# Patient Record
Sex: Female | Born: 1990 | Hispanic: Yes | Marital: Single | Smoking: Never smoker
Health system: Southern US, Community
[De-identification: ages and names within clinical notes are randomized; demographics above are authoritative.]

## PROBLEM LIST (undated history)

## (undated) DIAGNOSIS — O24419 Gestational diabetes mellitus in pregnancy, unspecified control: Secondary | ICD-10-CM

## (undated) DIAGNOSIS — D649 Anemia, unspecified: Secondary | ICD-10-CM

## (undated) HISTORY — PX: APPENDECTOMY: SHX54

## (undated) HISTORY — DX: Gestational diabetes mellitus in pregnancy, unspecified control: O24.419

## (undated) HISTORY — DX: Anemia, unspecified: D64.9

---
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
## 2009-12-10 ENCOUNTER — Ambulatory Visit: Payer: Self-pay | Admitting: Emergency Medicine

## 2009-12-12 LAB — PATHOLOGY REPORT

## 2011-03-03 ENCOUNTER — Ambulatory Visit: Payer: Self-pay | Admitting: Advanced Practice Midwife

## 2011-06-03 ENCOUNTER — Ambulatory Visit: Payer: Self-pay | Admitting: Advanced Practice Midwife

## 2011-07-08 ENCOUNTER — Observation Stay: Payer: Self-pay | Admitting: Obstetrics and Gynecology

## 2011-07-28 ENCOUNTER — Inpatient Hospital Stay: Payer: Self-pay | Admitting: Obstetrics and Gynecology

## 2011-07-28 LAB — CBC WITH DIFFERENTIAL/PLATELET
Basophil #: 0 10*3/uL (ref 0.0–0.1)
Eosinophil #: 0 10*3/uL (ref 0.0–0.7)
HCT: 40.8 % (ref 35.0–47.0)
Lymphocyte #: 1.8 10*3/uL (ref 1.0–3.6)
MCHC: 33.3 g/dL (ref 32.0–36.0)
MCV: 94 fL (ref 80–100)
Monocyte #: 0.3 x10 3/mm (ref 0.2–0.9)
Monocyte %: 5.2 %
Neutrophil #: 3.1 10*3/uL (ref 1.4–6.5)
Platelet: 110 10*3/uL — ABNORMAL LOW (ref 150–440)
RDW: 15 % — ABNORMAL HIGH (ref 11.5–14.5)
WBC: 5.3 10*3/uL (ref 3.6–11.0)

## 2011-07-29 LAB — HEMOGLOBIN: HGB: 9.9 g/dL — ABNORMAL LOW (ref 12.0–16.0)

## 2011-07-30 LAB — CBC WITH DIFFERENTIAL/PLATELET
Eosinophil %: 0.6 %
Lymphocyte #: 4.3 10*3/uL — ABNORMAL HIGH (ref 1.0–3.6)
Monocyte %: 4.2 %
Neutrophil #: 4 10*3/uL (ref 1.4–6.5)
RBC: 2.96 10*6/uL — ABNORMAL LOW (ref 3.80–5.20)

## 2012-12-09 IMAGING — US US OB FOLLOW-UP
1 series · 13 of 28 positions shown · non-contrast
Comparison: none

REASON FOR EXAM: reaccess placenta location
COMMENTS:

[Series 1: us ob follow-up · 0.28mm/px · 13 of 37 slices shown]
[im 2/37]
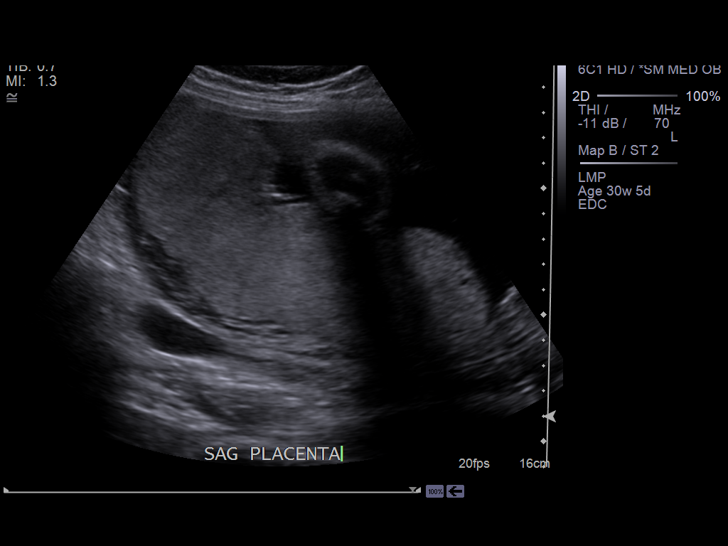
[im 5/37]
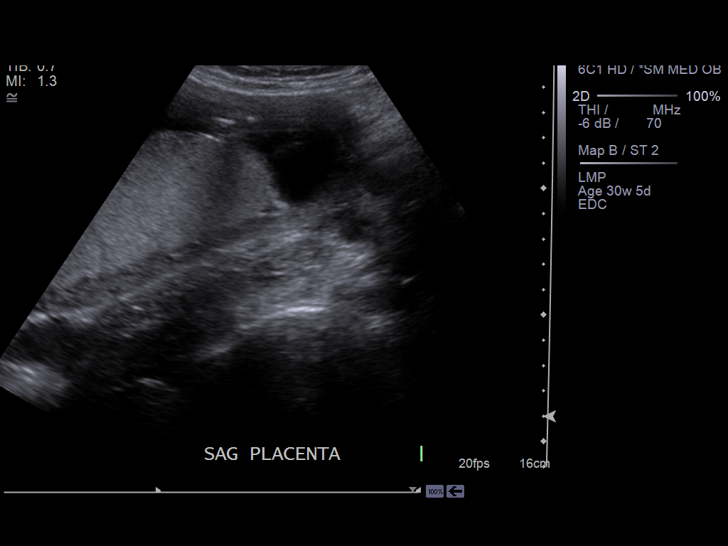
[im 7/37]
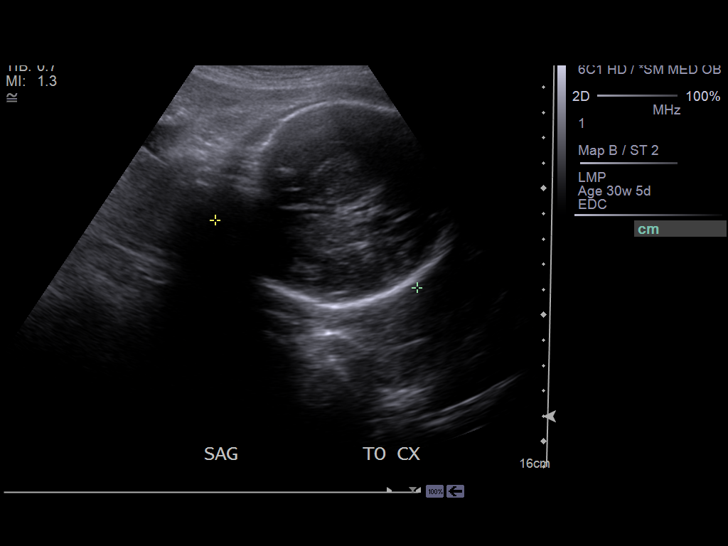
[im 10/37]
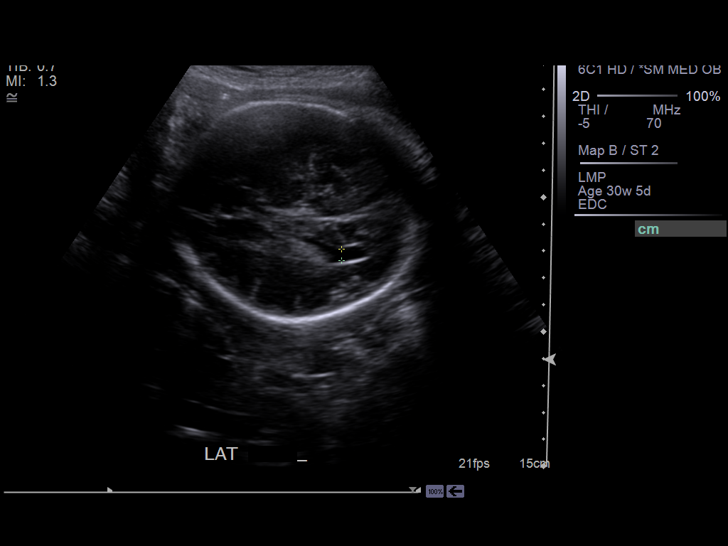
[im 13/37]
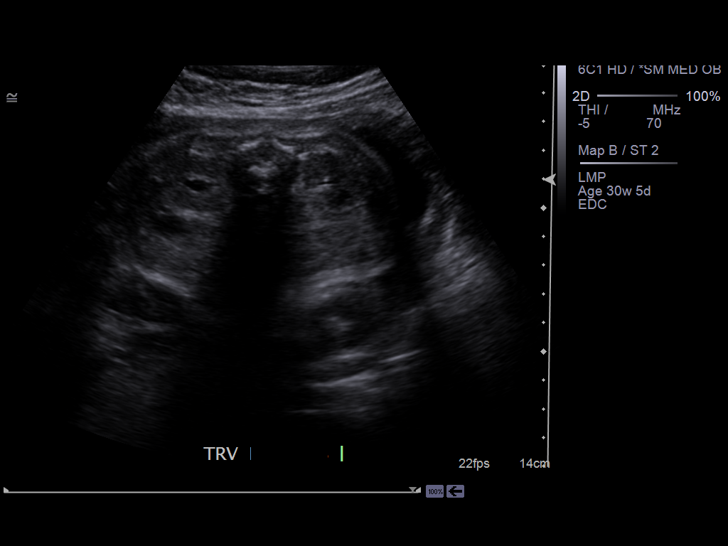
[im 15/37]
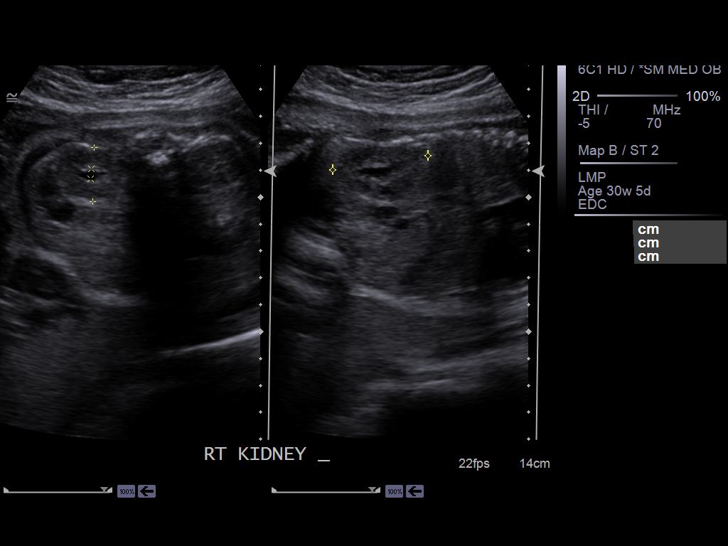
[im 19/37]
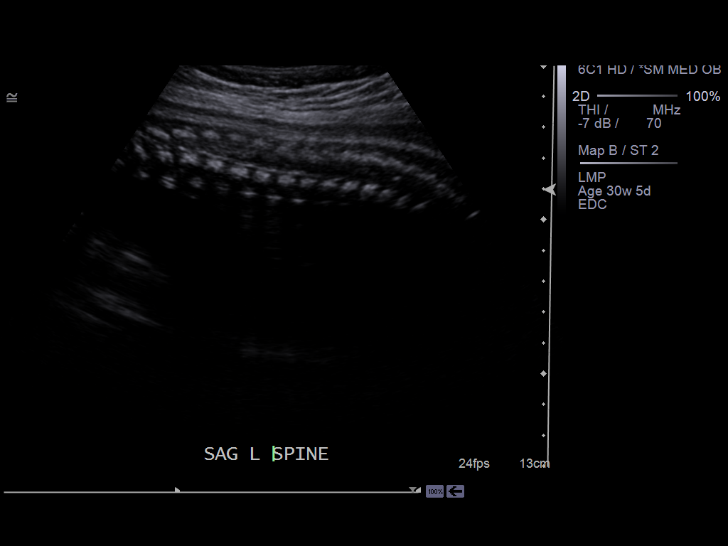
[im 22/37]
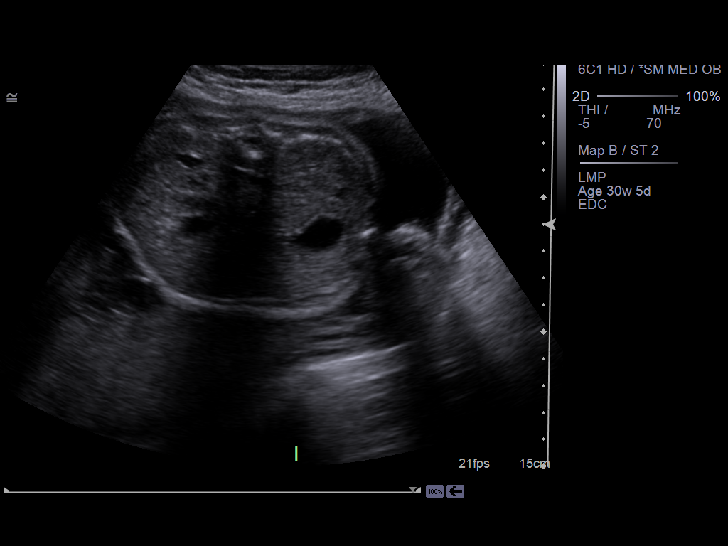
[im 25/37]
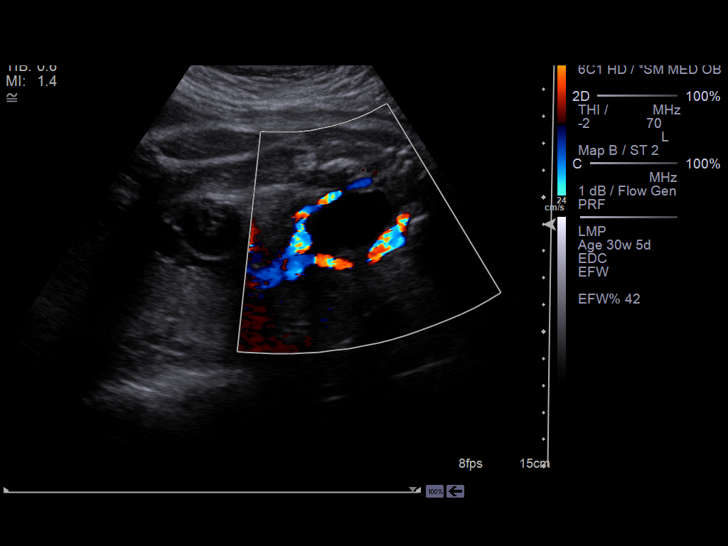
[im 27/37]
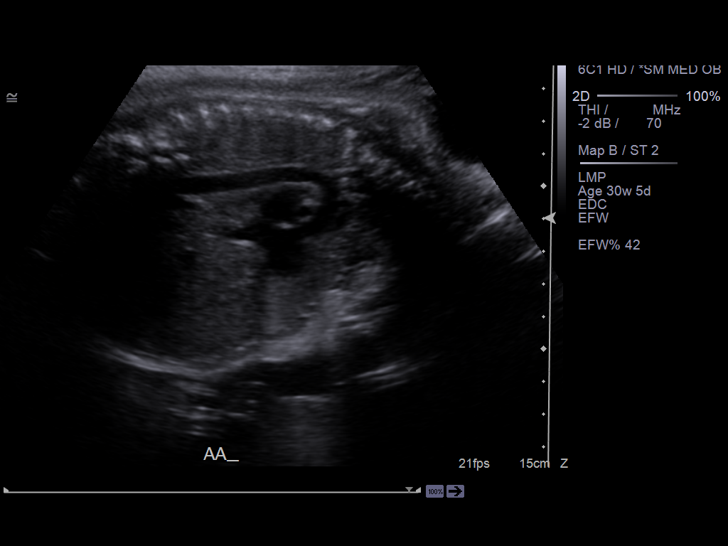
[im 30/37]
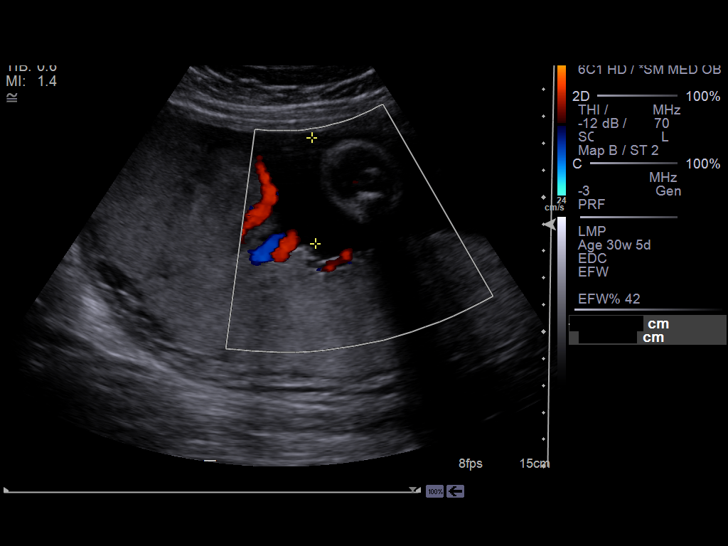
[im 33/37]
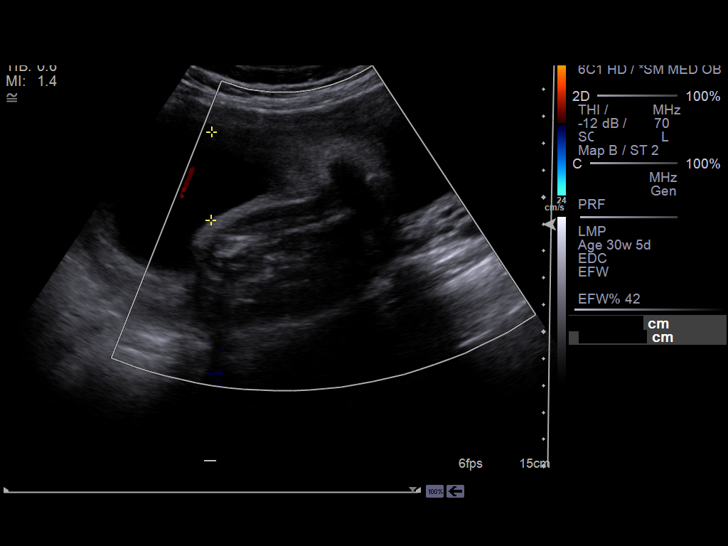
[im 35/37]
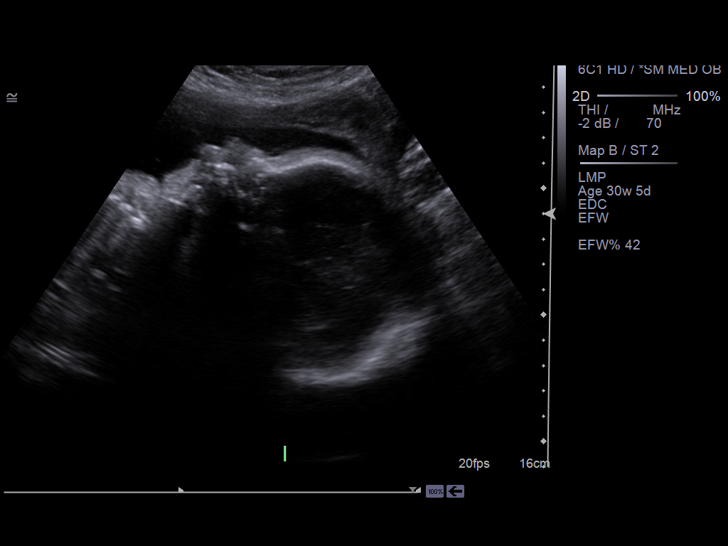

[13 of 28 positions shown; findings below may reference images not displayed]

PROCEDURE:     US  - US OB FOLLOW UP  - June 03, 2011 [DATE]

RESULT:     Follow-up OB examination was performed as requested. There is
observed a single living intrauterine gestation. Presentation currently is
cephalic. Fetal heart rate was monitored at 140 beats per minute. Amnionic
fluid volume appears normal. The placenta is posterior. The inferior tip of
the placenta is approximately 8.4 cm above the cervix. The placenta is no
longer low lying.

Fetal measurements are as follows:

          BPD is 7.93 cm; corresponding to 31 weeks, 6 days
            HC is 29.19 cm; corresponding to 32 weeks, 1 day
            AC is 26.84 cm; corresponding to 31 weeks, 0 days
            FL is 5.74 cm; corresponding to 30 weeks, 1 day
            HL is 4.89 cm; corresponding to 28 weeks, 5 days

EFW is 1,653 grams + / - 245 grams. Based on today's measurements, average
ultrasound age is 30 weeks, 5 days and ultrasound EDD is 08/17/2011. There
has been appropriate interval growth since the previous exam of March 03, 2011.
IMPRESSION: 1.  There is observed a living intrauterine gestation, cephalic presentation.
2.  The placenta is no longer low lying. The inferior tip of the placenta is
approximately 8.4 cm from the cervix.

[REDACTED]

## 2014-05-14 NOTE — H&P (Signed)
L&D Evaluation:  History:   HPI 24 yo G3P1011 with LMP of 11/01/10 & EDD of 08/08/11 by dates ad 08/06/11 by US at 17 weeks. PNC at [facility] significant for low lying placenta which resolved, Tdap UTD, [facility] with "UC's since last pm", no VB, no decreased FM, no ROM. UC's hurting pt and she wanted to get them checked out. 3 h Equivocal, diet education per nurtirionist.    Presents with contractions    Patient's Medical History No Chronic Illness    Patient's Surgical History Appendectomy  2011    Medications Pre Natal Vitamins    Allergies NKDA    Family History Non-Contributory   ROS:   ROS All systems were reviewed.  HEENT, CNS, GI, GU, Respiratory, CV, Renal and Musculoskeletal systems were found to be normal.   Exam:   Vital Signs stable    General no apparent distress    Mental Status clear    Chest clear    Heart normal sinus rhythm, no murmur/gallop/rubs    Abdomen gravid, non-tender    Estimated Fetal Weight Average for gestational age    Back no CVAT    Edema 1+    Reflexes 1+    Clonus negative    Pelvic cervix closed and thick    Mebranes Intact    FHT normal rate with no decels, reactive NST with 2 accels 15 x 15 bpm    Ucx irregular    Skin dry    Lymph no lymphadenopathy   Impression:   Impression IUp at 35 5/7 weeks   Plan:   Plan monitor contractions and for cervical change, Hydrate, rest, pelvic rest   Electronic Signatures: Sharee PimpleJones, Dorothymae Maciver W (CNM)  (Signed 04-Jul-13 10:06)  Authored: L&D Evaluation   Last Updated: 04-Jul-13 10:06 by Sharee PimpleJones, Pratham Cassatt W (CNM)

## 2017-01-04 NOTE — L&D Delivery Note (Signed)
Delivery Note Primary OB: [facility] Delivery Provider: Marcelyn Bruins, CNM Gestational Age: Full term Antepartum complications: gestational diabetes Intrapartum complications: None  A viable Female was delivered via vertex presentation, LOA.  No nuchal cord was present. Delivery of the shoulders and body followed without difficulty. The infant was placed on the maternal abdomen. The umbilical cord was doubly clamped and cut following delayed cord clamping. Cord blood was collected. The placenta was delivered spontaneously and was inspected and found to be intact with a three vessel cord. The cervix and vagina were inspected. There was a small hemostatic right labial abrasion that did not require repair. There were no other lacerations. The fundus was firm. Patient and infant were bonding in stable condition. All counts were correct.  Apgars: 9, 9  Weight:  7 lb 15 oz .   Placenta status: spontaneous and Intact.  Cord: 3 vessels Anesthesia:  none Episiotomy:  none Lacerations: right labial abrasion Suture Repair: none Est. Blood Loss (mL):  300 mL  Mom to postpartum.  Baby to Couplet care / Skin to Skin.  Marcelyn Bruins, CNM [facility], [facility] 10/11/2017  7:23 AM

## 2017-05-12 ENCOUNTER — Other Ambulatory Visit: Payer: Self-pay | Admitting: Physician Assistant

## 2017-05-12 DIAGNOSIS — Z3482 Encounter for supervision of other normal pregnancy, second trimester: Secondary | ICD-10-CM

## 2017-05-12 DIAGNOSIS — D649 Anemia, unspecified: Secondary | ICD-10-CM

## 2017-05-12 HISTORY — DX: Anemia, unspecified: D64.9

## 2017-05-12 LAB — HM PAP SMEAR: HM Pap smear: NEGATIVE

## 2017-06-02 ENCOUNTER — Ambulatory Visit
Admission: RE | Admit: 2017-06-02 | Discharge: 2017-06-02 | Disposition: A | Discharge status: Home / Self Care | Payer: 59 | Source: Ambulatory Visit | Attending: Physician Assistant | Admitting: Physician Assistant

## 2017-06-02 DIAGNOSIS — Z3A20 20 weeks gestation of pregnancy: Secondary | ICD-10-CM | POA: Diagnosis not present

## 2017-06-02 DIAGNOSIS — Z3482 Encounter for supervision of other normal pregnancy, second trimester: Secondary | ICD-10-CM

## 2017-08-04 LAB — HIV ANTIBODY (ROUTINE TESTING W REFLEX): HIV: NONREACTIVE

## 2017-08-19 ENCOUNTER — Encounter: Payer: 59 | Attending: Advanced Practice Midwife | Admitting: *Deleted

## 2017-08-19 ENCOUNTER — Encounter: Payer: Self-pay | Admitting: *Deleted

## 2017-08-19 VITALS — BP 98/68 | Ht 62.0 in | Wt 164.9 lb

## 2017-08-19 DIAGNOSIS — O24410 Gestational diabetes mellitus in pregnancy, diet controlled: Secondary | ICD-10-CM

## 2017-08-19 DIAGNOSIS — Z713 Dietary counseling and surveillance: Secondary | ICD-10-CM | POA: Insufficient documentation

## 2017-08-19 DIAGNOSIS — Z3A00 Weeks of gestation of pregnancy not specified: Secondary | ICD-10-CM | POA: Diagnosis not present

## 2017-08-19 DIAGNOSIS — O24419 Gestational diabetes mellitus in pregnancy, unspecified control: Secondary | ICD-10-CM | POA: Diagnosis not present

## 2017-08-19 NOTE — Patient Instructions (Signed)
Read booklet on Gestational Diabetes Follow Gestational Meal Planning Guidelines Limit fried foods Avoid fruit juices Avoid cold cereal for breakfast Complete a 3 Day Food Record and bring to next appointment Check blood sugars 4 x day - before breakfast and 2 hrs after every meal and record  Bring blood sugar log to all appointments Call MD for prescription for meter strips and lancets Strips  One Touch Verio  Lancets   One Touch Delica Purchase urine ketone strips if blood sugars not controlled and check urine ketones every am:  If + increase bedtime snack to 1 protein and 2 carbohydrate servings Walk 20-30 minutes at least 5 x week if permitted by MD

## 2017-08-19 NOTE — Progress Notes (Signed)
Diabetes Self-Management Education  Visit Type: First/Initial  Appt. Start Time: 0910 Appt. End Time: 1100  08/19/2017  [name], identified by name and date of birth, is a 27 y.o. female with a diagnosis of Diabetes: Gestational Diabetes.   ASSESSMENT  Blood pressure 98/68, height 5\' 2"  (1.575 m), weight 164 lb 14.4 oz (74.8 kg), last menstrual period 01/29/2017. Body mass index is 30.16 kg/m.  Diabetes Self-Management Education - 08/19/17 1341      Visit Information   Visit Type  First/Initial      Initial Visit   Diabetes Type  Gestational Diabetes    Are you currently following a meal plan?  Yes    What type of meal plan do you follow?  "decrease sodas"    Are you taking your medications as prescribed?  Yes    Date Diagnosed  2 weeks ago      Health Coping   How would you rate your overall health?  Good      Psychosocial Assessment   Patient Belief/Attitude about Diabetes  Other (comment)   "worries me"   Self-care barriers  None    Self-management support  Doctor's office    Patient Concerns  Nutrition/Meal planning;Glycemic Control;Weight Control;Healthy Lifestyle    Special Needs  None    Preferred Learning Style  Auditory;Visual    Learning Readiness  Change in progress    How often do you need to have someone help you when you read instructions, pamphlets, or other written materials from your doctor or pharmacy?  1 - Never    What is the last grade level you completed in school?  high school      Pre-Education Assessment   Patient understands the diabetes disease and treatment process.  Needs Instruction    Patient understands incorporating nutritional management into lifestyle.  Needs Instruction    Patient undertands incorporating physical activity into lifestyle.  Needs Instruction    Patient understands using medications safely.  Needs Instruction    Patient understands monitoring blood glucose, interpreting and using results  Needs  Instruction    Patient understands prevention, detection, and treatment of acute complications.  Needs Instruction    Patient understands prevention, detection, and treatment of chronic complications.  Needs Instruction    Patient understands how to develop strategies to address psychosocial issues.  Needs Instruction    Patient understands how to develop strategies to promote health/change behavior.  Needs Instruction      Complications   How often do you check your blood sugar?  0 times/day (not testing)   Provided One Touch Verio Flex and instructed on use. BG upon return demonstration was 70 mg/L at 10:40 am - 4 hrs pp.    Have you had a dilated eye exam in the past 12 months?  Yes    Have you had a dental exam in the past 12 months?  No    Are you checking your feet?  No      Dietary Intake   Breakfast  granola or cereal bar or cereal and milk; bread    Snack (morning)  granola bar or crackers    Lunch  tacos, occasional burgers, chicken sandwich    Snack (afternoon)  fruit and peanut butter    Dinner  beef, chicken, occasional pork, bread potatoes, tortillas, rice beans, pasta, corn, green beans, broccoli, salads    Beverage(s)  water, fruit juics      Exercise   Exercise Type  ADL's  Patient Education   Previous Diabetes Education  No    Disease state   Definition of diabetes, type 1 and 2, and the diagnosis of diabetes;Factors that contribute to the development of diabetes    Nutrition management   Role of diet in the treatment of diabetes and the relationship between the three main macronutrients and blood glucose level;Carbohydrate counting;Reviewed blood glucose goals for pre and post meals and how to evaluate the patients' food intake on their blood glucose level.    Physical activity and exercise   Role of exercise on diabetes management, blood pressure control and cardiac health.    Monitoring  Taught/evaluated SMBG meter.;Purpose and frequency of SMBG.;Taught/discussed  recording of test results and interpretation of SMBG.;Ketone testing, when, how.    Chronic complications  Relationship between chronic complications and blood glucose control    Psychosocial adjustment  Identified and addressed patients feelings and concerns about diabetes    Preconception care  Pregnancy and GDM  Role of pre-pregnancy blood glucose control on the development of the fetus;Role of family planning for patients with diabetes;Reviewed with patient blood glucose goals with pregnancy      Individualized Goals (developed by patient)   Reducing Risk  Improve blood sugars Lose weight Lead a healthier lifestyle Become more fit     Outcomes   Expected Outcomes  Demonstrated interest in learning. Expect positive outcomes    Future DMSE  2 wks       Individualized Plan for Diabetes Self-Management Training:   Learning Objective:  Patient will have a greater understanding of diabetes self-management. Patient education plan is to attend individual and/or group sessions per assessed needs and concerns.   Plan:   Patient Instructions  Read booklet on Gestational Diabetes Follow Gestational Meal Planning Guidelines Limit fried foods Avoid fruit juices Avoid cold cereal for breakfast Complete a 3 Day Food Record and bring to next appointment Check blood sugars 4 x day - before breakfast and 2 hrs after every meal and record  Bring blood sugar log to all appointments Call MD for prescription for meter strips and lancets Strips  One Touch Verio  Lancets   One Touch Delica Purchase urine ketone strips if blood sugars not controlled and check urine ketones every am:  If + increase bedtime snack to 1 protein and 2 carbohydrate servings Walk 20-30 minutes at least 5 x week if permitted by MD   Expected Outcomes:  Demonstrated interest in learning. Expect positive outcomes  Education material provided:  Gestational Booklet Gestational Meal Planning Guidelines Simple Meal  Plan Viewed Gestational Diabetes Video Meter = One Touch Verio Flex 3 Day Food Record Goals for a Healthy Pregnancy  If problems or questions, patient to contact team via:  Sharion SettlerSheila Lott Seelbach, RN, CCM, CDE [phone]  Future DSME appointment: 2 wks  August 30, 2017 with the dietitian

## 2017-08-30 ENCOUNTER — Ambulatory Visit: Payer: Commercial Managed Care - PPO | Admitting: Dietician

## 2017-09-08 ENCOUNTER — Encounter: Payer: Self-pay | Admitting: Dietician

## 2017-09-08 NOTE — Progress Notes (Signed)
Have not yet heard back from patient to reschedule her cancelled appointment from 08/30/17. Sent letter to referring provider.

## 2017-10-10 ENCOUNTER — Other Ambulatory Visit: Payer: Self-pay

## 2017-10-10 ENCOUNTER — Inpatient Hospital Stay
Admission: EM | Admit: 2017-10-10 | Discharge: 2017-10-12 | DRG: 806 | Disposition: A | Discharge status: Home / Self Care | Payer: 59 | Attending: Obstetrics and Gynecology | Admitting: Obstetrics and Gynecology

## 2017-10-10 DIAGNOSIS — Z3A39 39 weeks gestation of pregnancy: Secondary | ICD-10-CM

## 2017-10-10 DIAGNOSIS — O24420 Gestational diabetes mellitus in childbirth, diet controlled: Principal | ICD-10-CM | POA: Diagnosis present

## 2017-10-10 DIAGNOSIS — D62 Acute posthemorrhagic anemia: Secondary | ICD-10-CM | POA: Diagnosis not present

## 2017-10-10 DIAGNOSIS — O9081 Anemia of the puerperium: Secondary | ICD-10-CM | POA: Diagnosis not present

## 2017-10-10 NOTE — OB Triage Note (Addendum)
Pt presents with complaints of contractions every 10 minutes for the last hour and a small amount of bleeding. Denies N/V/D and intercourse in the last 24 hours. Positive fetal movement. Pt reports that she has been diagnosed with Gestational Diabetes. She was last seen at the health department this morning.

## 2017-10-11 ENCOUNTER — Other Ambulatory Visit: Payer: Self-pay

## 2017-10-11 DIAGNOSIS — D62 Acute posthemorrhagic anemia: Secondary | ICD-10-CM | POA: Diagnosis not present

## 2017-10-11 DIAGNOSIS — O9081 Anemia of the puerperium: Secondary | ICD-10-CM | POA: Diagnosis not present

## 2017-10-11 DIAGNOSIS — Z3483 Encounter for supervision of other normal pregnancy, third trimester: Secondary | ICD-10-CM | POA: Diagnosis present

## 2017-10-11 DIAGNOSIS — O24420 Gestational diabetes mellitus in childbirth, diet controlled: Secondary | ICD-10-CM

## 2017-10-11 DIAGNOSIS — Z3A39 39 weeks gestation of pregnancy: Secondary | ICD-10-CM | POA: Diagnosis not present

## 2017-10-11 LAB — CBC
HCT: 31.2 % — ABNORMAL LOW (ref 36.0–46.0)
HEMATOCRIT: 35.9 % (ref 35.0–47.0)
HEMOGLOBIN: 10.2 g/dL — AB (ref 12.0–15.0)
Hemoglobin: 11.8 g/dL — ABNORMAL LOW (ref 12.0–16.0)
MCH: 28.1 pg (ref 26.0–34.0)
MCH: 28.1 pg (ref 26.0–34.0)
MCHC: 32.9 g/dL (ref 32.0–36.0)
MCHC: 32.7 g/dL (ref 30.0–36.0)
MCV: 85.5 fL (ref 80.0–100.0)
MCV: 86 fL (ref 80.0–100.0)
Platelets: 158 10*3/uL (ref 150–440)
Platelets: 149 10*3/uL — ABNORMAL LOW (ref 150–400)
RBC: 4.2 MIL/uL (ref 3.80–5.20)
RBC: 3.63 MIL/uL — ABNORMAL LOW (ref 3.87–5.11)
RDW: 17.3 % — AB (ref 11.5–14.5)
RDW: 16 % — AB (ref 11.5–15.5)
WBC: 9 10*3/uL (ref 3.6–11.0)
WBC: 11.4 10*3/uL — AB (ref 4.0–10.5)
nRBC: 0 % (ref 0.0–0.2)

## 2017-10-11 LAB — TYPE AND SCREEN
ABO/RH(D): O POS
Antibody Screen: NEGATIVE

## 2017-10-11 LAB — GLUCOSE, CAPILLARY: Glucose-Capillary: 95 mg/dL (ref 70–99)

## 2017-10-11 MED ORDER — OXYCODONE-ACETAMINOPHEN 5-325 MG PO TABS: 1.0000 | ORAL_TABLET | ORAL | Status: DC | PRN

## 2017-10-11 MED ORDER — SENNOSIDES-DOCUSATE SODIUM 8.6-50 MG PO TABS
2.0000 | ORAL_TABLET | ORAL | Status: DC
  Administered 2017-10-12: 2 via ORAL
  Filled 2017-10-11: qty 2

## 2017-10-11 MED ORDER — OXYTOCIN 10 UNIT/ML IJ SOLN
INTRAMUSCULAR | Status: AC
  Filled 2017-10-11: qty 2

## 2017-10-11 MED ORDER — OXYCODONE-ACETAMINOPHEN 5-325 MG PO TABS
2.0000 | ORAL_TABLET | ORAL | Status: DC | PRN
  Administered 2017-10-11 (×2): 2 via ORAL
  Filled 2017-10-11 (×2): qty 2

## 2017-10-11 MED ORDER — ONDANSETRON HCL 4 MG/2ML IJ SOLN: 4.0000 mg | INTRAMUSCULAR | Status: DC | PRN

## 2017-10-11 MED ORDER — OXYTOCIN 40 UNITS IN LACTATED RINGERS INFUSION - SIMPLE MED
INTRAVENOUS | Status: AC
  Filled 2017-10-11: qty 1000

## 2017-10-11 MED ORDER — ONDANSETRON HCL 4 MG PO TABS: 4.0000 mg | ORAL_TABLET | ORAL | Status: DC | PRN

## 2017-10-11 MED ORDER — FENTANYL CITRATE (PF) 100 MCG/2ML IJ SOLN
50.0000 ug | INTRAMUSCULAR | Status: DC | PRN
  Administered 2017-10-11: 50 ug via INTRAVENOUS
  Filled 2017-10-11: qty 2

## 2017-10-11 MED ORDER — LIDOCAINE HCL (PF) 1 % IJ SOLN
INTRAMUSCULAR | Status: AC
  Filled 2017-10-11: qty 30

## 2017-10-11 MED ORDER — IBUPROFEN 600 MG PO TABS
600.0000 mg | ORAL_TABLET | Freq: Four times a day (QID) | ORAL | Status: DC
  Administered 2017-10-11: 600 mg via ORAL
  Filled 2017-10-11: qty 1

## 2017-10-11 MED ORDER — COCONUT OIL OIL: 1.0000 "application " | TOPICAL_OIL | Status: DC | PRN

## 2017-10-11 MED ORDER — LACTATED RINGERS IV SOLN
INTRAVENOUS | Status: DC
  Administered 2017-10-11: 04:00:00 via INTRAVENOUS

## 2017-10-11 MED ORDER — WITCH HAZEL-GLYCERIN EX PADS: 1.0000 "application " | MEDICATED_PAD | CUTANEOUS | Status: DC | PRN

## 2017-10-11 MED ORDER — DIPHENHYDRAMINE HCL 25 MG PO CAPS: 25.0000 mg | ORAL_CAPSULE | Freq: Four times a day (QID) | ORAL | Status: DC | PRN

## 2017-10-11 MED ORDER — BENZOCAINE-MENTHOL 20-0.5 % EX AERO: 1.0000 "application " | INHALATION_SPRAY | CUTANEOUS | Status: DC | PRN

## 2017-10-11 MED ORDER — IBUPROFEN 600 MG PO TABS
600.0000 mg | ORAL_TABLET | Freq: Four times a day (QID) | ORAL | Status: DC
  Administered 2017-10-11 – 2017-10-12 (×4): 600 mg via ORAL
  Filled 2017-10-11 (×4): qty 1

## 2017-10-11 MED ORDER — ACETAMINOPHEN 325 MG PO TABS: 650.0000 mg | ORAL_TABLET | ORAL | Status: DC | PRN

## 2017-10-11 MED ORDER — AMMONIA AROMATIC IN INHA
RESPIRATORY_TRACT | Status: AC
  Filled 2017-10-11: qty 10

## 2017-10-11 MED ORDER — SIMETHICONE 80 MG PO CHEW: 80.0000 mg | CHEWABLE_TABLET | ORAL | Status: DC | PRN

## 2017-10-11 MED ORDER — OXYTOCIN 40 UNITS IN LACTATED RINGERS INFUSION - SIMPLE MED: 2.5000 [IU]/h | INTRAVENOUS | Status: DC

## 2017-10-11 MED ORDER — ACETAMINOPHEN 325 MG PO TABS
650.0000 mg | ORAL_TABLET | ORAL | Status: DC | PRN
  Administered 2017-10-11 – 2017-10-12 (×2): 650 mg via ORAL
  Filled 2017-10-11 (×2): qty 2

## 2017-10-11 MED ORDER — MISOPROSTOL 200 MCG PO TABS
ORAL_TABLET | ORAL | Status: AC
  Filled 2017-10-11: qty 4

## 2017-10-11 MED ORDER — LACTATED RINGERS IV SOLN: 500.0000 mL | INTRAVENOUS | Status: DC | PRN

## 2017-10-11 MED ORDER — PRENATAL MULTIVITAMIN CH
1.0000 | ORAL_TABLET | Freq: Every day | ORAL | Status: DC
  Administered 2017-10-11 – 2017-10-12 (×2): 1 via ORAL
  Filled 2017-10-11 (×2): qty 1

## 2017-10-11 MED ORDER — DIBUCAINE 1 % RE OINT: 1.0000 "application " | TOPICAL_OINTMENT | RECTAL | Status: DC | PRN

## 2017-10-11 MED ORDER — ONDANSETRON HCL 4 MG/2ML IJ SOLN: 4.0000 mg | Freq: Four times a day (QID) | INTRAMUSCULAR | Status: DC | PRN

## 2017-10-11 MED ORDER — OXYTOCIN BOLUS FROM INFUSION: 500.0000 mL | Freq: Once | INTRAVENOUS | Status: DC

## 2017-10-11 NOTE — Discharge Summary (Signed)
OB Discharge Summary     Patient Name: Diane Vincent DOB: 09/02/1990 MRN: [id number]  Date of admission: 10/10/2017 Delivering Provider: Oswaldo Conroy, CNM  Date of Delivery: 10/11/2017  Date of discharge: 10/12/2017 Admitting diagnosis: Pregnancy Intrauterine pregnancy: [redacted]w[redacted]d     Secondary diagnosis: Gestational Diabetes diet controlled (A1)     Discharge diagnosis: Term Pregnancy Delivered,                          Hospital course:  Onset of Labor With Vaginal Delivery     27 y.o. yo G4P1011 at [redacted]w[redacted]d was admitted in Active Labor on 10/10/2017. Patient had an uncomplicated labor course as follows:  Membrane Rupture Time/Date: 6:43 AM ,10/11/2017   Intrapartum Procedures: Episiotomy: None [1]                                         Lacerations:  None [1]  Patient had a delivery of a Viable infant. 10/11/2017  Information for the patient's newborn:  Catelin, Manthe [[id number]]  Delivery Method: Vag-Spont    Pateint had an uncomplicated postpartum course.  She is ambulating, tolerating a regular diet, passing flatus, and urinating well. Patient is discharged home in stable condition on 10/12/2017                                                                  Post partum procedures:none  Complications: None  Physical exam on 10/12/2017: Vitals:   10/11/17 2040 10/11/17 2354 10/11/17 2355 10/12/17 0747  BP: (!) 104/58 (!) 105/50 (!) 96/54 102/74  Pulse: 68 72 70 62  Resp:    18  Temp:  98.1 F (36.7 C)  97.8 F (36.6 C)  TempSrc:  Oral  Oral  SpO2:  96%  97%  Weight:      Height:       General: alert, cooperative and no distress Lochia: appropriate Uterine Fundus: firm/ U-1/NT/slightly deviated to the right (needs to void) DVT Evaluation: No evidence of DVT seen on physical exam.  Labs: Lab Results  Component Value Date   WBC 11.4 (H) 10/11/2017   HGB 10.2 (L) 10/11/2017   HCT 31.2 (L) 10/11/2017   MCV 86.0 10/11/2017   PLT 149 (L) 10/11/2017    No flowsheet data found.  Discharge instruction: per After Visit Summary.  Medications:  Allergies as of 10/12/2017   No Known Allergies     Medication List    TAKE these medications   famotidine 40 MG tablet Commonly known as:  PEPCID Take 40 mg by mouth daily.   PRENATAL VITAMINS PO Take 1 tablet by mouth daily.       Diet: routine diet  Activity: Advance as tolerated. Pelvic rest for 6 weeks.   Outpatient follow up: Follow-up Information    Department, [facility]. Schedule an appointment as soon as possible for a visit in 6 week(s).   Why:  for a postpartum check and for a 2 hour glucose tolerance test. Contact information: [address] [place] [address] [phone]  Postpartum contraception: Undecided Rhogam Given postpartum: no Rubella vaccine given postpartum: no Varicella vaccine given postpartum: no TDaP given antepartum or postpartum: Yes, AP 08/04/2017  Newborn Data: Live born female / Janyth Pupa Birth Weight:  7#15.3oz APGAR: 72, 9  Newborn Delivery   Birth date/time:  10/11/2017 06:51:00 Delivery type:  Vaginal, Spontaneous      Baby Feeding: Formula  Disposition:home with mother  SIGNED:  Farrel Conners, CNM 10/12/2017 11:36 AM

## 2017-10-11 NOTE — H&P (Signed)
Obstetrics Admission History & Physical     HPI:  27 y.o. G4P0010 @ [redacted]w[redacted]d (10/17/2017, by Other Basis). Admitted on 10/10/2017:   Patient Active Problem List   Diagnosis Date Noted  . Indication for care in labor and delivery, antepartum 10/11/2017  . Normal labor 10/11/2017     Presents for contractions beginning around 10 pm on 10/7 every 10 minutes that have become more painful and more frequent. She made cervical change from 2.5 to 4.5 cm since arriving on triage and her contractions are close together. She has had some bloody show with exams. She denies loss of fluid. Her baby is moving well.  Prenatal care at: at [facility]. Pregnancy complicated by gestational DM.  ROS: A review of systems was performed and negative, except as stated in the above HPI. PMHx:  Past Medical History:  Diagnosis Date  . Gestational diabetes    PSHx:  Past Surgical History:  Procedure Laterality Date  . APPENDECTOMY     Medications:  Medications Prior to Admission  Medication Sig Dispense Refill Last Dose  . famotidine (PEPCID) 40 MG tablet Take 40 mg by mouth daily.    10/10/2017 at Unknown time  . Prenatal Multivit-Min-Fe-FA (PRENATAL VITAMINS PO) Take 1 tablet by mouth daily.   10/09/2017 at Unknown time   Allergies: has No Known Allergies. OBHx:  OB History  Gravida Para Term Preterm AB Living  4       1    SAB TAB Ectopic Multiple Live Births  1            # Outcome Date GA Lbr Len/2nd Weight Sex Delivery Anes PTL Lv  4 Current           3 Gravida           2 Gravida           1 SAB            ZOX:[id number] except as detailed in HPI.Marland Kitchen  No family history of birth defects. Soc Hx: Never smoker, Alcohol: none, Recreational drug use: none and Pregnancy welcomed  Objective:   Vitals:   10/10/17 2359  BP: 112/62  Pulse: 75  Temp: 98.2 F (36.8 C)   Constitutional: Well nourished, well developed female, breathing through contractions HEENT: normal Skin: Warm and dry.   Cardiovascular: Regular rate and rhythm, no murmurs, rubs, or gallops   Extremity: trace to 1+ bilateral pedal edema Respiratory: Clear to auscultation bilaterally. Normal respiratory effort Abdomen: moderate with contractions Neuro: Cranial nerves grossly intact Psych: Alert and Oriented x3. No memory deficits. Normal mood and affect.  MS: normal gait, normal bilateral lower extremity ROM/strength/stability.  Pelvic exam: Cervix: 4.5/70/-2 (RN exam) Uterus: Spontaneous uterine activity  Adnexa: not evaluated  EFM:FHR:  bpm, variability: moderate,  accelerations:  Present,  decelerations:  Absent Toco: Frequency: Every 1.5 - 3 minutes, Duration: 50-80 seconds and Intensity: moderate   Perinatal info:  Blood type: O positive Rubella - Immune Varicella - Immune TDaP Given during third trimester of this pregnancy on 08/04/2017 RPR NR / HIV Neg/ HBsAg Neg   Assessment & Plan:   27 y.o. G4P0010 @ [redacted]w[redacted]d, Admitted on 10/10/2017 in labor.    Admit for labor, Observe for cervical change, Fetal Wellbeing Reassuring, AROM when Appropriate and GBS status negative, treat as needed. Check BG q 4 hours.  Marcelyn Bruins, CNM [facility], [facility] 10/11/2017  3:31 AM

## 2017-10-12 ENCOUNTER — Encounter: Payer: Self-pay | Admitting: Certified Nurse Midwife

## 2017-10-12 LAB — RPR: RPR: NONREACTIVE

## 2017-10-12 NOTE — Progress Notes (Signed)
MD order for pt discharge home.  Parents given all discharge instructions and understand all.  Pt discharged home with baby via carseat in wheelchair.

## 2017-10-12 NOTE — Discharge Instructions (Signed)
°Vaginal Delivery, Care After °Refer to this sheet in the next few weeks. These discharge instructions provide you with information on caring for yourself after delivery. Your caregiver may also give you specific instructions. Your treatment has been planned according to the most current medical practices available, but problems sometimes occur. Call your caregiver if you have any problems or questions after you go home. °HOME CARE INSTRUCTIONS °1. Take over-the-counter or prescription medicines only as directed by your caregiver or pharmacist. °2. Do not drink alcohol, especially if you are breastfeeding or taking medicine to relieve pain. °3. Do not smoke tobacco. °4. Continue to use good perineal care. Good perineal care includes: °1. Wiping your perineum from back to front °2. Keeping your perineum clean. °3. You can do sitz baths twice a day, to help keep this area clean °5. Do not use tampons, douche or have sex for 6 weeks °6. Shower only and avoid sitting in submerged water, aside from sitz baths °7. Wear a well-fitting bra that provides breast support. °8. Eat healthy foods. °9. Drink enough fluids to keep your urine clear or pale yellow. °10. Eat high-fiber foods such as whole grain cereals and breads, brown rice, beans, and fresh fruits and vegetables every day. These foods may help prevent or relieve constipation. °11. Avoid constipation with high fiber foods or medications, such as miralax or metamucil °12. Follow your caregiver's recommendations regarding resumption of activities such as climbing stairs, driving, lifting, exercising, or traveling. °13. Talk to your caregiver about resuming sexual activities. Resumption of sexual activities after 6 weeks is dependent upon your risk of infection, your rate of healing, and your comfort and desire to resume sexual activity. °14. Try to have someone help you with your household activities and your newborn for at least a few days after you leave the  hospital. °15. Rest as much as possible. Try to rest or take a nap when your newborn is sleeping. °16. Increase your activities gradually. °17. Keep all of your scheduled postpartum appointments. It is very important to keep your scheduled follow-up appointments. At these appointments, your caregiver will be checking to make sure that you are healing physically and emotionally. °SEEK MEDICAL CARE IF:  °· You are passing large clots from your vagina. Save any clots to show your caregiver. °· You have a foul smelling discharge from your vagina. °· You have trouble urinating. °· You are urinating frequently. °· You have pain when you urinate. °· You have a change in your bowel movements. °· You have increasing redness, pain, or swelling near your vaginal incision (episiotomy) or vaginal tear. °· You have pus draining from your episiotomy or vaginal tear. °· Your episiotomy or vaginal tear is separating. °· You have painful, hard, or reddened breasts. °· You have a severe headache. °· You have blurred vision or see spots. °· You feel sad or depressed. °· You have thoughts of hurting yourself or your newborn. °· You have questions about your care, the care of your newborn, or medicines. °· You are dizzy or light-headed. °· You have a rash. °· You have nausea or vomiting. °· You were breastfeeding and have not had a menstrual period within 12 weeks after you stopped breastfeeding. °· You are not breastfeeding and have not had a menstrual period by the 12th week after delivery. °· You have a fever of 100.5 or more °SEEK IMMEDIATE MEDICAL CARE IF:  °· You have persistent pain. °· You have chest pain. °· You have shortness   of breath.  You faint.  You have leg pain.  You have stomach pain.  Your vaginal bleeding saturates two or more sanitary pads in 1 hour. MAKE SURE YOU:   Understand these instructions.  Will watch your condition.  Will get help right away if you are not doing well or get worse. Document  Released: 12/19/1999 Document Revised: 05/07/2013 Document Reviewed: 08/18/2011 [facility] Patient Information 2015 [place], [place]. This information is not intended to replace advice given to you by your health care provider. Make sure you discuss any questions you have with your health care provider.  Sitz Bath A sitz bath is a warm water bath taken in the sitting position. The water covers only the hips and butt (buttocks). We recommend using one that fits in the toilet, to help with ease of use and cleanliness. It may be used for either healing or cleaning purposes. Sitz baths are also used to relieve pain, itching, or muscle tightening (spasms). The water may contain medicine. Moist heat will help you heal and relax.  HOME CARE  Take 3 to 4 sitz baths a day. 18. Fill the bathtub half-full with warm water. 19. Sit in the water and open the drain a little. 20. Turn on the warm water to keep the tub half-full. Keep the water running constantly. 21. Soak in the water for 15 to 20 minutes. 22. After the sitz bath, pat the affected area dry. GET HELP RIGHT AWAY IF: You get worse instead of better. Stop the sitz baths if you get worse. MAKE SURE YOU:  Understand these instructions.  Will watch your condition.  Will get help right away if you are not doing well or get worse. Document Released: 01/29/2004 Document Revised: 09/15/2011 Document Reviewed: 04/20/2010 [facility] Patient Information 2015 [place], [place]. This information is not intended to replace advice given to you by your health care provider. Make sure you discuss any questions you have with your health care provider.   Intrauterine Device Information An intrauterine device (IUD) is inserted into your uterus to prevent pregnancy. There are two types of IUDs available:  Copper IUD--This type of IUD is wrapped in copper wire and is placed inside the uterus. Copper makes the uterus and fallopian tubes produce a fluid that kills sperm.  The copper IUD can stay in place for 10 years.  Hormone IUD--This type of IUD contains the hormone progestin (synthetic progesterone). The hormone thickens the cervical mucus and prevents sperm from entering the uterus. It also thins the uterine lining to prevent implantation of a fertilized egg. The hormone can weaken or kill the sperm that get into the uterus. One type of hormone IUD can stay in place for 5 years, and another type can stay in place for 3 years.  Your health care provider will make sure you are a good candidate for a contraceptive IUD. Discuss with your health care provider the possible side effects. Advantages of an intrauterine device  IUDs are highly effective, reversible, long acting, and low maintenance.  There are no estrogen-related side effects.  An IUD can be used when breastfeeding.  IUDs are not associated with weight gain.  The copper IUD works immediately after insertion.  The hormone IUD works right away if inserted within 7 days of your period starting. You will need to use a backup method of birth control for 7 days if the hormone IUD is inserted at any other time in your cycle.  The copper IUD does not interfere with your  female hormones.  The hormone IUD can make heavy menstrual periods lighter and decrease cramping.  The hormone IUD can be used for 3 or 5 years.  The copper IUD can be used for 10 years. Disadvantages of an intrauterine device  The hormone IUD can be associated with irregular bleeding patterns.  The copper IUD can make your menstrual flow heavier and more painful.  You may experience cramping and vaginal bleeding after insertion. This information is not intended to replace advice given to you by your health care provider. Make sure you discuss any questions you have with your health care provider. Document Released: 11/25/2003 Document Revised: 05/29/2015 Document Reviewed: 06/11/2012 Elsevier Interactive Patient Education  2017  [employer]  Laparoscopic Tubal Ligation, Care After Refer to this sheet in the next few weeks. These instructions provide you with information about caring for yourself after your procedure. Your health care provider may also give you more specific instructions. Your treatment has been planned according to current medical practices, but problems sometimes occur. Call your health care provider if you have any problems or questions after your procedure. What can I expect after the procedure? After the procedure, it is common to have:  A sore throat.  Discomfort in your shoulder.  Mild discomfort or cramping in your abdomen.  Gas pains.  Pain or soreness in the area where the surgical cut (incision) was made.  A bloated feeling.  Tiredness.  Nausea.  Vomiting.  Follow these instructions at home: Medicines  Take over-the-counter and prescription medicines only as told by your health care provider.  Do not take aspirin because it can cause bleeding.  Do not drive or operate heavy machinery while taking prescription pain medicine. Activity  Rest for the rest of the day.  Return to your normal activities as told by your health care provider. Ask your health care provider what activities are safe for you. Incision care   Follow instructions from your health care provider about how to take care of your incision. Make sure you: ? Wash your hands with soap and water before you change your bandage (dressing). If soap and water are not available, use hand sanitizer. ? Change your dressing as told by your health care provider. ? Leave stitches (sutures) in place. They may need to stay in place for 2 weeks or longer.  Check your incision area every day for signs of infection. Check for: ? More redness, swelling, or pain. ? More fluid or blood. ? Warmth. ? Pus or a bad smell. Other Instructions  Do not take baths, swim, or use a hot tub until your health care provider  approves. You may take showers.  Keep all follow-up visits as told by your health care provider. This is important.  Have someone help you with your daily household tasks for the first few days. Contact a health care provider if:  You have more redness, swelling, or pain around your incision.  Your incision feels warm to the touch.  You have pus or a bad smell coming from your incision.  The edges of your incision break open after the sutures have been removed.  Your pain does not improve after 2-3 days.  You have a rash.  You repeatedly become dizzy or light-headed.  Your pain medicine is not helping.  You are constipated. Get help right away if:  You have a fever.  You faint.  You have increasing pain in your abdomen.  You have severe pain in one or  both of your shoulders.  You have fluid or blood coming from your sutures or from your vagina.  You have shortness of breath or difficulty breathing.  You have chest pain or leg pain.  You have ongoing nausea, vomiting, or diarrhea. This information is not intended to replace advice given to you by your health care provider. Make sure you discuss any questions you have with your health care provider. Document Released: 07/10/2004 Document Revised: 05/26/2015 Document Reviewed: 12/01/2014 Elsevier Interactive Patient Education  [employer].

## 2017-10-12 NOTE — Progress Notes (Signed)
Post Partum Day 1 Subjective: no complaints, up ad lib, voiding and tolerating PO. Would like to be discharged today  Objective: Blood pressure 102/74, pulse 62, temperature 97.8 F (36.6 C), temperature source Oral, resp. rate 18, height 5\' 2"  (1.575 m), weight 76.2 kg, last menstrual period 01/29/2017, SpO2 97 %, unknown if currently breastfeeding.  Physical Exam:  General: alert, cooperative and no distress Lochia: appropriate Uterine Fundus: firm/ U-1/NT/slightly deviated to the right (needs to void)  DVT Evaluation: No evidence of DVT seen on physical exam.  Recent Labs    10/11/17 0342 10/11/17 1307  HGB 11.8* 10.2*  HCT 35.9 31.2*  WBC 9.0 11.4*  PLT 158 149*    Assessment/Plan: Stable PPD #1-discharge today if baby released Mild anemia from acute blood loss-continue vitamins with iron O POS/RI/VI TDAP  UTD Bottle Contraception: undecided. Discussed pills, IUDs, interval tubal    LOS: 1 day   Farrel Conners 10/12/2017, 10:47 AM

## 2017-11-21 DIAGNOSIS — E663 Overweight: Secondary | ICD-10-CM | POA: Insufficient documentation

## 2018-02-14 DIAGNOSIS — E663 Overweight: Secondary | ICD-10-CM

## 2018-12-09 IMAGING — US US OB COMP +14 WK
1 series · 14 of 28 positions shown · non-contrast
Comparison: none

CLINICAL DATA: Fetal anatomy and dating evaluation.

EXAM:
OBSTETRICAL ULTRASOUND >14 WKS

[Series 1: us ob comp +14 wk · 14 of 94 slices shown]
[im 4/94]
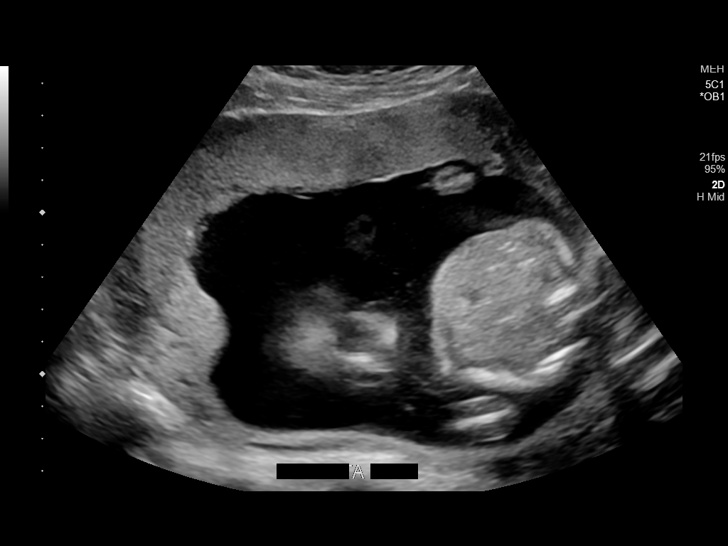
[im 11/94]
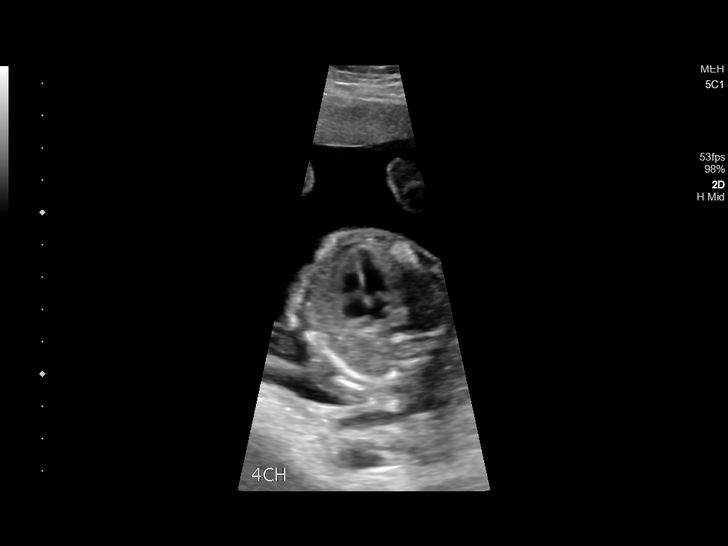
[im 18/94]
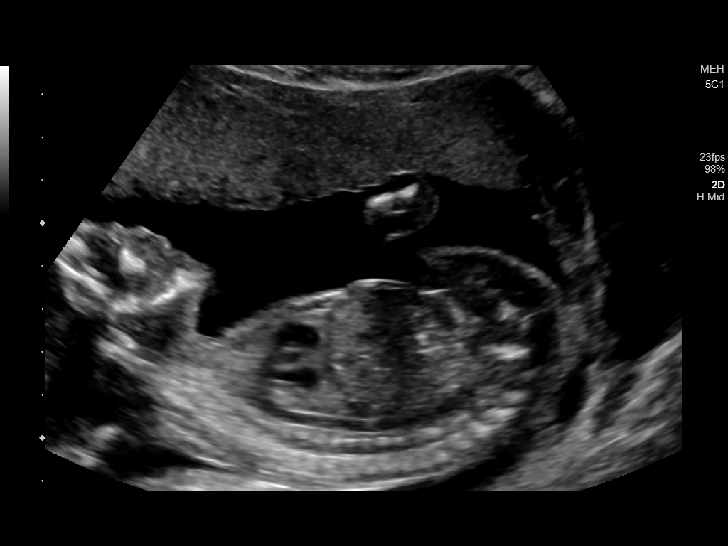
[im 25/94]
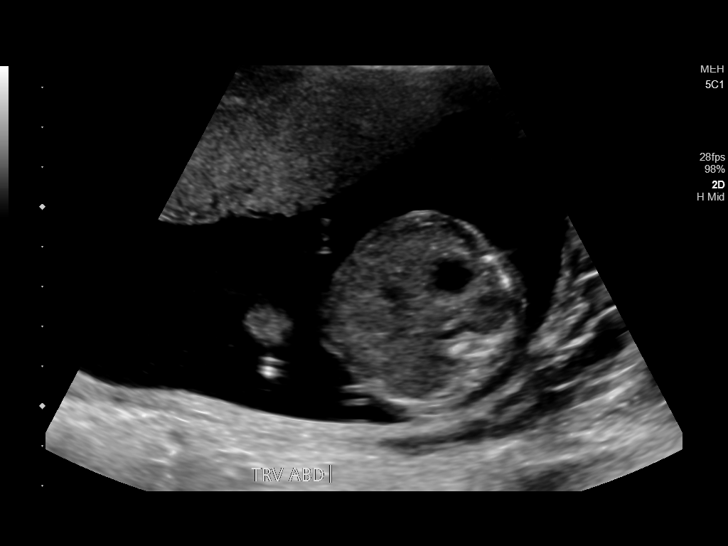
[im 32/94]
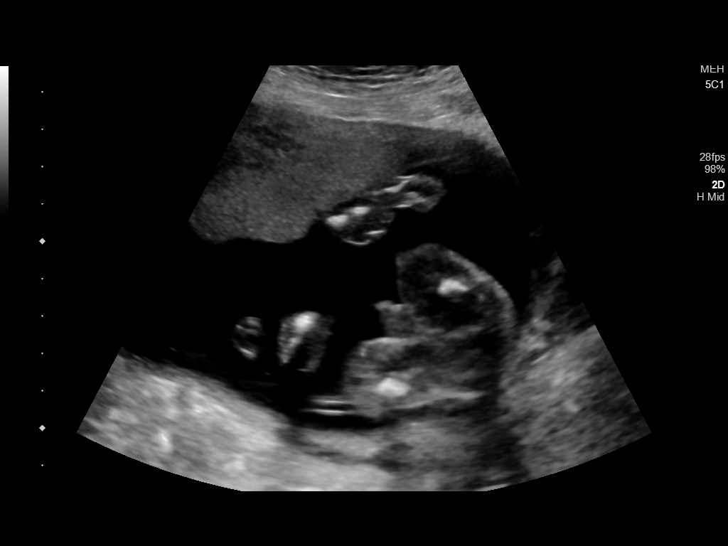
[im 38/94]
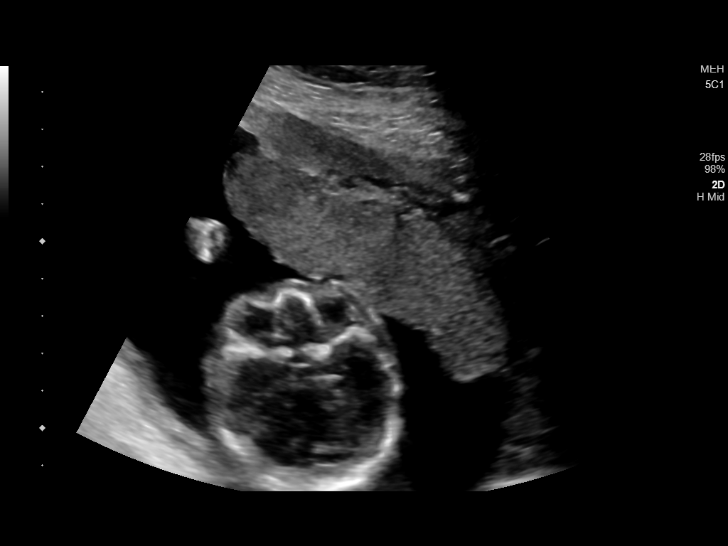
[im 45/94]
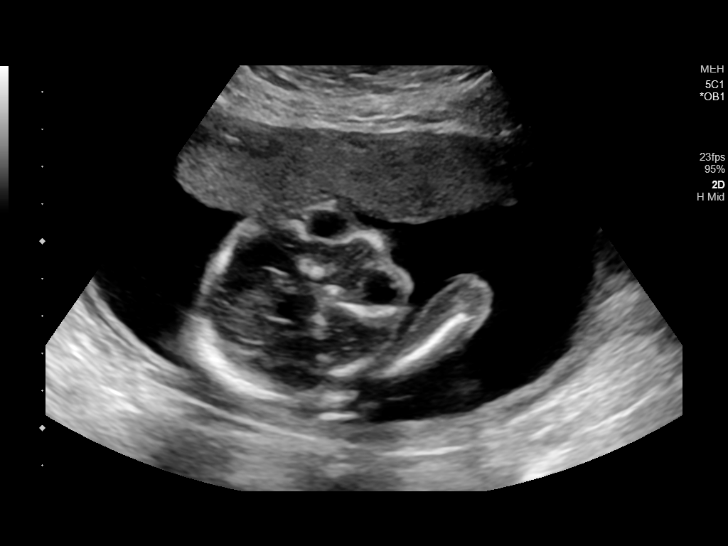
[im 52/94]
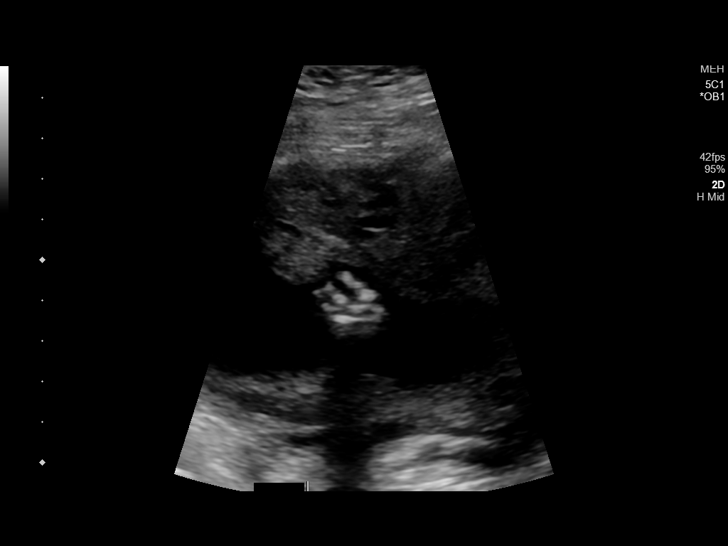
[im 59/94]
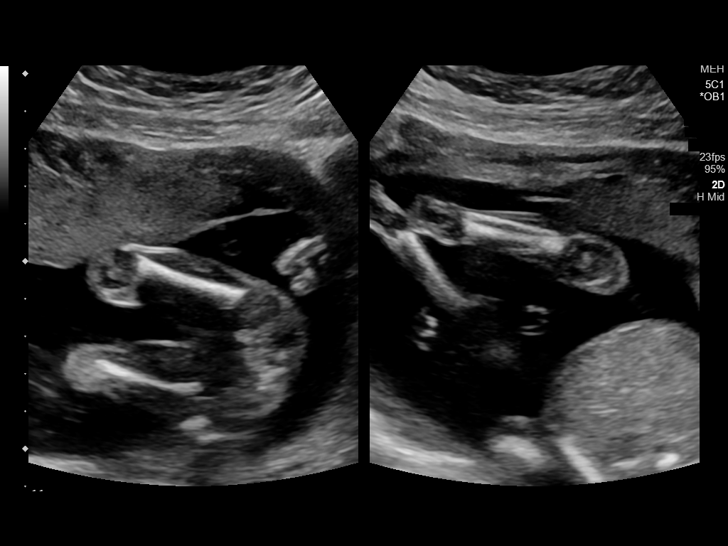
[im 66/94]
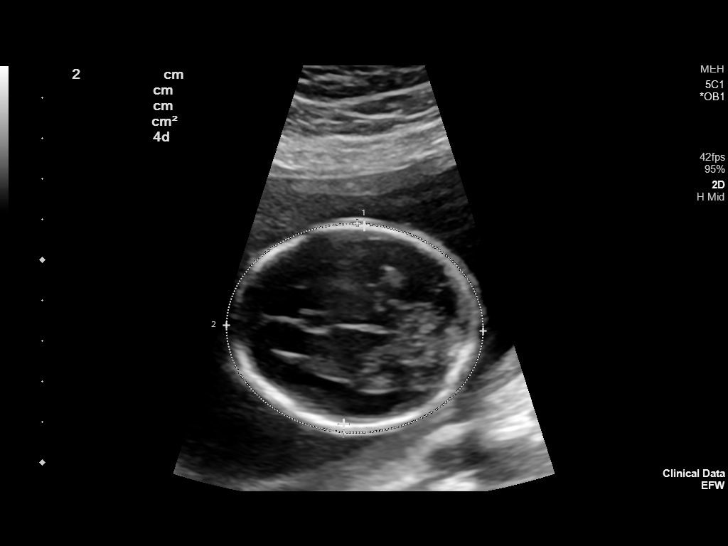
[im 73/94]
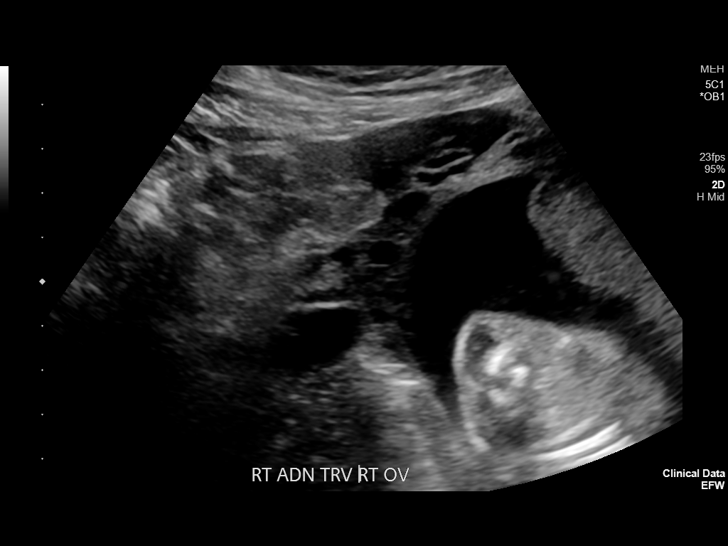
[im 80/94]
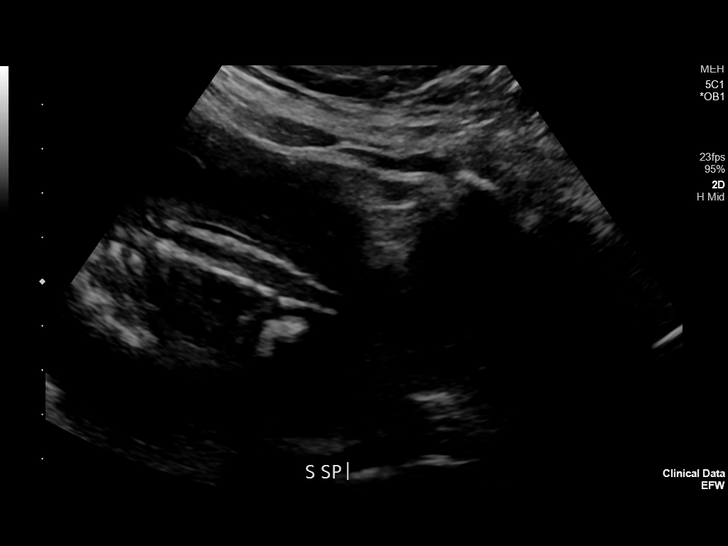
[im 87/94]
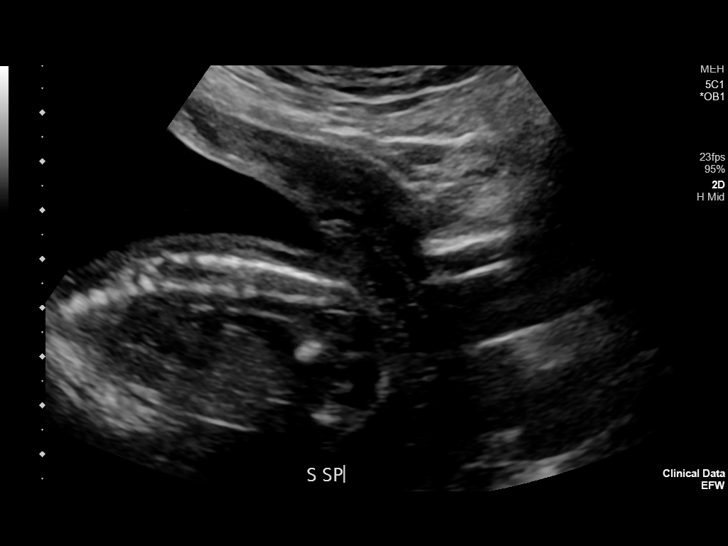
[im 94/94]
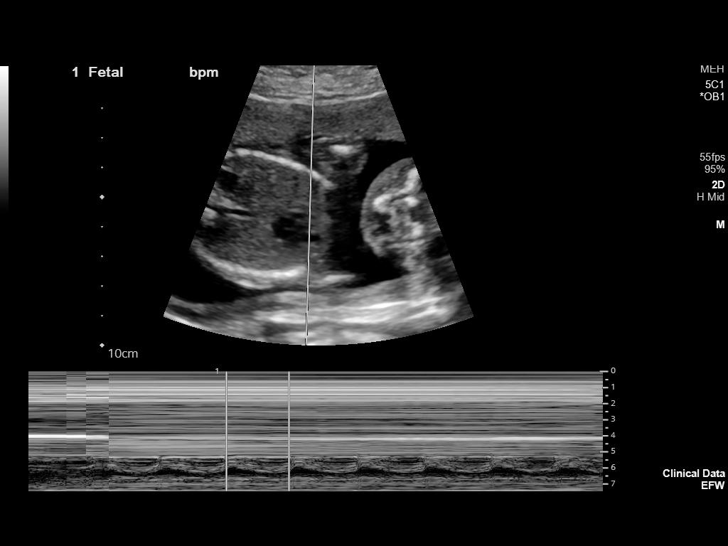

[14 of 28 positions shown; findings below may reference images not displayed]

FINDINGS: Number of Fetuses: 1

Heart Rate:  163 bpm

Movement: Present

Presentation: Variable

Previa: No

Placental Location: Anterior fundal

Amniotic Fluid (Subjective): Normal

Amniotic Fluid (Objective):

Vertical pocket 4.7cm

FETAL BIOMETRY

BPD:  4.9cm 20w 6d

HC:    18.2cm 20w 4d

AC:   15.4cm 20w 4d

FL:   3.3cm 20w 1d

Current Mean GA: 20w 1d              US EDC: 10/17/2017

FETAL ANATOMY

Lateral Ventricles: Visualized

Thalami/CSP: Visualized

Posterior Fossa:  Visualized

Nuchal Region: Visualized

Upper Lip: Visualized

Spine: Visualized

4 Chamber Heart on Left: Visualized

LVOT: Visualized

RVOT: Visualized

Stomach on Left: Visualized

3 Vessel Cord: Visualized

Cord Insertion site: Visualized

Kidneys: Visualized

Bladder: Visualized

Extremities: Visualized

Maternal Findings:

Cervix:  3.6 cm and closed.
IMPRESSION: Single viable intrauterine pregnancy at 20 weeks 1 day. Variable
presentation.

## 2021-01-15 ENCOUNTER — Other Ambulatory Visit: Payer: Self-pay

## 2021-01-15 ENCOUNTER — Encounter: Payer: Self-pay | Admitting: Advanced Practice Midwife

## 2021-01-15 ENCOUNTER — Ambulatory Visit (LOCAL_COMMUNITY_HEALTH_CENTER): Payer: Self-pay | Admitting: Advanced Practice Midwife

## 2021-01-15 VITALS — BP 108/66 | Ht 62.0 in | Wt 161.8 lb

## 2021-01-15 DIAGNOSIS — Z3009 Encounter for other general counseling and advice on contraception: Secondary | ICD-10-CM

## 2021-01-15 DIAGNOSIS — Z3049 Encounter for surveillance of other contraceptives: Secondary | ICD-10-CM

## 2021-01-15 DIAGNOSIS — Z8632 Personal history of gestational diabetes: Secondary | ICD-10-CM | POA: Insufficient documentation

## 2021-01-15 LAB — WET PREP FOR TRICH, YEAST, CLUE
Trichomonas Exam: NEGATIVE
Yeast Exam: NEGATIVE

## 2021-01-15 NOTE — Progress Notes (Signed)
[facility] Main Number: [phone]    Family Planning Visit- Initial Visit  Subjective:  Diane Vincent is a 31 y.o. SHF nonsmoker [id number] (12,9,3)  being seen today for an initial annual visit and to discuss reproductive life planning.  The patient is currently using IUD or IUS for pregnancy prevention. Patient reports   does not want a pregnancy in the next year.  Patient has the following medical conditions has Overweight BMI=29.5 on their problem list.  Chief Complaint  Patient presents with   Annual Exam    Patient reports here for physical and pap. LMP spotting. Last sex 01/01/21 without condom; with current partner x 14 1/2 years; 1 partner in last 3 mo. Last ETOH 01/03/21 (1 glass wine) 2x/year. Last dental exam 3 years ago.  Living with her partner, 3 kids, MGM, parents. Employed 40 hrs/wk.  Patient denies cigs, vaping, cigar, MJ  Body mass index is 29.59 kg/m. - Patient is eligible for diabetes screening based on BMI and age [id number]?  not applicable [id number] ordered? no  Patient reports 1  partner/s in last year. Desires STI screening?  No - declines bloodwork  Has patient been screened once for HCV in the past?  No  No results found for: HCVAB  Does the patient have current drug use (including MJ), have a partner with drug use, and/or has been incarcerated since last result? No  If yes-- Screen for HCV through [facility]   Does the patient meet criteria for HBV testing? No  Criteria:  -Household, sexual or needle sharing contact with HBV -History of drug use -HIV positive -Those with known Hep C   Health Maintenance Due  Topic Date Due   COVID-19 Vaccine (1) Never done   Hepatitis C Screening  Never done   TETANUS/TDAP  Never done   PAP SMEAR-Modifier  05/12/2020   INFLUENZA VACCINE  Never done    Review of Systems  Gastrointestinal:  Positive for abdominal pain (c/o low  abdominal cramps not related to menses sometimes relieved with Tylenol 1-2x/mo can last 4 hours).  All other systems reviewed and are negative.  The following portions of the patient's history were reviewed and updated as appropriate: allergies, current medications, past family history, past medical history, past social history, past surgical history and problem list. Problem list updated.   See flowsheet for other program required questions.  Objective:   Vitals:   01/15/21 1341  BP: 108/66  Weight: 161 lb 12.8 oz (73.4 kg)  Height: 5\' 2"  (1.575 m)    Physical Exam Constitutional:      Appearance: Normal appearance. She is normal weight.  HENT:     Head: Normocephalic and atraumatic.     Mouth/Throat:     Mouth: Mucous membranes are moist.     Comments: Last dental exam 3 years ago Eyes:     Conjunctiva/sclera: Conjunctivae normal.  Cardiovascular:     Rate and Rhythm: Normal rate and regular rhythm.  Pulmonary:     Effort: Pulmonary effort is normal.     Breath sounds: Normal breath sounds.  Chest:  Breasts:    Right: Normal.     Left: Normal.  Abdominal:     Palpations: Abdomen is soft.     Comments: Poor tone, soft without masses or tenderness  Genitourinary:    General: Normal vulva.     Exam position: Lithotomy position.     Vagina: Vaginal discharge (  clear mucousy leukorrhea, ph>4.5) present.     Cervix: Normal.     Uterus: Normal.      Adnexa: Right adnexa normal and left adnexa normal.     Rectum: Normal.     Comments: IUD strings seen Pap done Musculoskeletal:        General: Normal range of motion.     Cervical back: Normal range of motion and neck supple.  Skin:    General: Skin is warm and dry.  Neurological:     Mental Status: She is alert.  Psychiatric:        Mood and Affect: Mood normal.      Assessment and Plan:  Diane Vincent is a 31 y.o. female presenting to the [facility] Department for an initial annual  wellness/contraceptive visit  Contraception counseling: Reviewed all forms of birth control options in the tiered based approach. available including abstinence; over the counter/barrier methods; hormonal contraceptive medication including pill, patch, ring, injection,contraceptive implant, ECP; hormonal and nonhormonal IUDs; permanent sterilization options including vasectomy and the various tubal sterilization modalities. Risks, benefits, and typical effectiveness rates were reviewed.  Questions were answered.  Written information was also given to the patient to review.  Patient desires IUD or IUS, this was prescribed for patient.    The patient will follow up in  1 year for surveillance.  The patient was told to call with any further questions, or with any concerns about this method of contraception.  Emphasized use of condoms 100% of the time for STI prevention.  Patient was not offered ECP based on not meeting criteria. ECP not accepted by the patient. ECP counseling was not given - see RN documentation  1. Family planning Treat wet mount per standing orders Immunization nurse consult - WET PREP FOR [place], YEAST, CLUE - Chlamydia/Gonorrhea [facility] - IGP, Aptima HPV  2. Encounter for surveillance of other contraceptive      No follow-ups on file.  No future appointments.  Diane Vincent, CNM

## 2021-01-15 NOTE — Progress Notes (Signed)
Pt here for PE and Pap.  Wet mount results reviewed and no treatment required, per SO.  Berdie Ogren, RN

## 2021-01-18 LAB — IGP, APTIMA HPV
HPV Aptima: NEGATIVE
PAP Smear Comment: 0

## 2021-09-08 ENCOUNTER — Encounter: Payer: Self-pay | Admitting: Podiatry

## 2021-09-08 ENCOUNTER — Ambulatory Visit (INDEPENDENT_AMBULATORY_CARE_PROVIDER_SITE_OTHER): Payer: Self-pay | Admitting: Podiatry

## 2021-09-08 DIAGNOSIS — L600 Ingrowing nail: Secondary | ICD-10-CM

## 2021-09-08 MED ORDER — DOXYCYCLINE HYCLATE 100 MG PO TABS: 100.0000 mg | ORAL_TABLET | Freq: Two times a day (BID) | ORAL | 0 refills | Status: AC

## 2021-09-15 ENCOUNTER — Encounter: Payer: Self-pay | Admitting: Podiatry

## 2021-09-15 NOTE — Progress Notes (Signed)
  Subjective:  Patient ID: Diane Vincent, female    DOB: Sep 25, 1990,  MRN: [id number]  Chief Complaint  Patient presents with   Nail Problem    31 y.o. female presents with the above complaint.  Patient presents with complaint of right medial hallux border ingrown with mild erythema.  Patient states been present for quite some time is progressive gotten worse.  She would like she states ingrown hurts with ambulation hurts with pressure she would like to have it removed she has not seen anyone else prior to seeing me.  She denies any other acute complaints.   Review of Systems: Negative except as noted in the HPI. Denies N/V/F/Ch.  Past Medical History:  Diagnosis Date   Anemia 05/12/2017   During pregnancy   Gestational diabetes     Current Outpatient Medications:    doxycycline (VIBRA-TABS) 100 MG tablet, Take 1 tablet (100 mg total) by mouth 2 (two) times daily for 10 days., Disp: 20 tablet, Rfl: 0   clotrimazole-betamethasone (LOTRISONE) cream, Apply topically., Disp: , Rfl:    famotidine (PEPCID) 40 MG tablet, Take 40 mg by mouth daily. , Disp: , Rfl:    fluconazole (DIFLUCAN) 150 MG tablet, Take by mouth., Disp: , Rfl:    levonorgestrel (MIRENA) 20 MCG/24HR IUD, 1 each by Intrauterine route once., Disp: , Rfl:    Prenatal Multivit-Min-Fe-FA (PRENATAL VITAMINS PO), Take 1 tablet by mouth daily. (Patient not taking: Reported on 01/15/2021), Disp: , Rfl:   Social History   Tobacco Use  Smoking Status Never  Smokeless Tobacco Never    No Known Allergies Objective:  There were no vitals filed for this visit. There is no height or weight on file to calculate BMI. Constitutional Well developed. Well nourished.  Vascular Dorsalis pedis pulses palpable bilaterally. Posterior tibial pulses palpable bilaterally. Capillary refill normal to all digits.  No cyanosis or clubbing noted. Pedal hair growth normal.  Neurologic Normal speech. Oriented to person, place, and  time. Epicritic sensation to light touch grossly present bilaterally.  Dermatologic Painful ingrowing nail at medial nail borders of the hallux nail right. No other open wounds. No skin lesions.  Orthopedic: Normal joint ROM without pain or crepitus bilaterally. No visible deformities. No bony tenderness.   Radiographs: None Assessment:   1. Ingrown toenail of right foot    Plan:  Patient was evaluated and treated and all questions answered.  Ingrown Nail, right -Patient elects to proceed with minor surgery to remove ingrown toenail removal today. Consent reviewed and signed by patient. -Ingrown nail excised. See procedure note. -Educated on post-procedure care including soaking. Written instructions provided and reviewed. -Patient to follow up in 2 weeks for nail check. -Doxycycline was dispensed for skin and soft tissue prophylaxis  Procedure: Excision of Ingrown Toenail Location: Right 1st toe medial nail borders. Anesthesia: Lidocaine 1% plain; 1.5 mL and Marcaine 0.5% plain; 1.5 mL, digital block. Skin Prep: Betadine. Dressing: Silvadene; telfa; dry, sterile, compression dressing. Technique: Following skin prep, the toe was exsanguinated and a tourniquet was secured at the base of the toe. The affected nail border was freed, split with a nail splitter, and excised. Chemical matrixectomy was then performed with phenol and irrigated out with alcohol. The tourniquet was then removed and sterile dressing applied. Disposition: Patient tolerated procedure well. Patient to return in 2 weeks for follow-up.   No follow-ups on file.

## 2021-09-17 ENCOUNTER — Ambulatory Visit (INDEPENDENT_AMBULATORY_CARE_PROVIDER_SITE_OTHER): Payer: Self-pay | Admitting: Podiatry

## 2021-09-17 DIAGNOSIS — L600 Ingrowing nail: Secondary | ICD-10-CM

## 2021-09-29 NOTE — Progress Notes (Signed)
  Subjective:  Patient ID: Diane Vincent, female    DOB: [birth year],  MRN: [id number]  Chief Complaint  Patient presents with   Ingrown Toenail    31 y.o. female presents with the above complaint.  Patient presents with complaint left medial border ingrown painful to touch.  The right is healing fine.  She wanted get it evaluated she has not seen MRIs prior to seeing me.  She would like to have it removed she denies any other acute complaints.  Hurts with ambulation hurts with pressure pain scale 7 out of 10   Review of Systems: Negative except as noted in the HPI. Denies N/V/F/Ch.  Past Medical History:  Diagnosis Date   Anemia 05/12/2017   During pregnancy   Gestational diabetes     Current Outpatient Medications:    clotrimazole-betamethasone (LOTRISONE) cream, Apply topically., Disp: , Rfl:    famotidine (PEPCID) 40 MG tablet, Take 40 mg by mouth daily. , Disp: , Rfl:    fluconazole (DIFLUCAN) 150 MG tablet, Take by mouth., Disp: , Rfl:    levonorgestrel (MIRENA) 20 MCG/24HR IUD, 1 each by Intrauterine route once., Disp: , Rfl:    Prenatal Multivit-Min-Fe-FA (PRENATAL VITAMINS PO), Take 1 tablet by mouth daily. (Patient not taking: Reported on 01/15/2021), Disp: , Rfl:   Social History   Tobacco Use  Smoking Status Never  Smokeless Tobacco Never    No Known Allergies Objective:  There were no vitals filed for this visit. There is no height or weight on file to calculate BMI. Constitutional Well developed. Well nourished.  Vascular Dorsalis pedis pulses palpable bilaterally. Posterior tibial pulses palpable bilaterally. Capillary refill normal to all digits.  No cyanosis or clubbing noted. Pedal hair growth normal.  Neurologic Normal speech. Oriented to person, place, and time. Epicritic sensation to light touch grossly present bilaterally.  Dermatologic Painful ingrowing nail at medial nail borders of the hallux nail left. No other open wounds. No skin  lesions.  Orthopedic: Normal joint ROM without pain or crepitus bilaterally. No visible deformities. No bony tenderness.   Radiographs: None Assessment:   1. Ingrown left big toenail    Plan:  Patient was evaluated and treated and all questions answered.  Ingrown Nail, left -Patient elects to proceed with minor surgery to remove ingrown toenail removal today. Consent reviewed and signed by patient. -Ingrown nail excised. See procedure note. -Educated on post-procedure care including soaking. Written instructions provided and reviewed. -Patient to follow up in 2 weeks for nail check.  Procedure: Excision of Ingrown Toenail Location: Left 1st toe medial nail borders. Anesthesia: Lidocaine 1% plain; 1.5 mL and Marcaine 0.5% plain; 1.5 mL, digital block. Skin Prep: Betadine. Dressing: Silvadene; telfa; dry, sterile, compression dressing. Technique: Following skin prep, the toe was exsanguinated and a tourniquet was secured at the base of the toe. The affected nail border was freed, split with a nail splitter, and excised. Chemical matrixectomy was then performed with phenol and irrigated out with alcohol. The tourniquet was then removed and sterile dressing applied. Disposition: Patient tolerated procedure well. Patient to return in 2 weeks for follow-up.   No follow-ups on file.

## 2021-10-01 ENCOUNTER — Ambulatory Visit: Payer: Self-pay | Admitting: Podiatry

## 2022-05-27 ENCOUNTER — Ambulatory Visit: Payer: Self-pay | Admitting: Podiatry

## 2022-06-03 ENCOUNTER — Ambulatory Visit (INDEPENDENT_AMBULATORY_CARE_PROVIDER_SITE_OTHER): Payer: Self-pay | Admitting: Podiatry

## 2022-06-03 ENCOUNTER — Encounter: Payer: Self-pay | Admitting: Podiatry

## 2022-06-03 VITALS — BP 119/72 | HR 80

## 2022-06-03 DIAGNOSIS — L600 Ingrowing nail: Secondary | ICD-10-CM

## 2022-06-03 NOTE — Progress Notes (Signed)
  Subjective:  Patient ID: Diane Vincent, female    DOB: [birth year],  MRN: [id number]  Chief Complaint  Patient presents with   Nail Problem    "I had an ingrown toenail.  I went to the ER for it.  They put me on pills because it was infected.  So, I want to make sure the nail is not still in there and that the infection is gone."    32 y.o. female presents with the above complaint.  Patient presents with complaint of right lateral hallux border ingrown with mild erythema.  Patient states been present for quite some time is progressive gotten worse.  She would like she states ingrown hurts with ambulation hurts with pressure she would like to have it removed she has not seen anyone else prior to seeing me.  She denies any other acute complaints.   Review of Systems: Negative except as noted in the HPI. Denies N/V/F/Ch.  Past Medical History:  Diagnosis Date   Anemia 05/12/2017   During pregnancy   Gestational diabetes     Current Outpatient Medications:    clotrimazole-betamethasone (LOTRISONE) cream, Apply topically., Disp: , Rfl:    famotidine (PEPCID) 40 MG tablet, Take 40 mg by mouth daily. , Disp: , Rfl:    fluconazole (DIFLUCAN) 150 MG tablet, Take by mouth., Disp: , Rfl:    levonorgestrel (MIRENA) 20 MCG/24HR IUD, 1 each by Intrauterine route once., Disp: , Rfl:    Prenatal Multivit-Min-Fe-FA (PRENATAL VITAMINS PO), Take 1 tablet by mouth daily., Disp: , Rfl:   Social History   Tobacco Use  Smoking Status Never  Smokeless Tobacco Never    No Known Allergies Objective:   Vitals:   06/03/22 0955  BP: 119/72  Pulse: 80   There is no height or weight on file to calculate BMI. Constitutional Well developed. Well nourished.  Vascular Dorsalis pedis pulses palpable bilaterally. Posterior tibial pulses palpable bilaterally. Capillary refill normal to all digits.  No cyanosis or clubbing noted. Pedal hair growth normal.  Neurologic Normal speech. Oriented to  person, place, and time. Epicritic sensation to light touch grossly present bilaterally.  Dermatologic Painful ingrowing nail at lateral nail borders of the hallux nail right. No other open wounds. No skin lesions.  Orthopedic: Normal joint ROM without pain or crepitus bilaterally. No visible deformities. No bony tenderness.   Radiographs: None Assessment:   1. Ingrown toenail of right foot     Plan:  Patient was evaluated and treated and all questions answered.  Ingrown Nail, right -Patient elects to proceed with minor surgery to remove ingrown toenail removal today. Consent reviewed and signed by patient. -Ingrown nail excised. See procedure note. -Educated on post-procedure care including soaking. Written instructions provided and reviewed. -Patient to follow up in 2 weeks for nail check. -Doxycycline was dispensed for skin and soft tissue prophylaxis  Procedure: Excision of Ingrown Toenail Location: Right 1st toe lateral nail borders. Anesthesia: Lidocaine 1% plain; 1.5 mL and Marcaine 0.5% plain; 1.5 mL, digital block. Skin Prep: Betadine. Dressing: Silvadene; telfa; dry, sterile, compression dressing. Technique: Following skin prep, the toe was exsanguinated and a tourniquet was secured at the base of the toe. The affected nail border was freed, split with a nail splitter, and excised. Chemical matrixectomy was then performed with phenol and irrigated out with alcohol. The tourniquet was then removed and sterile dressing applied. Disposition: Patient tolerated procedure well. Patient to return in 2 weeks for follow-up.   No follow-ups on file.

## 2022-06-04 ENCOUNTER — Ambulatory Visit: Payer: Self-pay | Admitting: Podiatry

## 2022-08-26 ENCOUNTER — Ambulatory Visit (INDEPENDENT_AMBULATORY_CARE_PROVIDER_SITE_OTHER): Payer: Self-pay | Admitting: Podiatry

## 2022-08-26 DIAGNOSIS — L600 Ingrowing nail: Secondary | ICD-10-CM

## 2022-08-26 NOTE — Progress Notes (Signed)
  Subjective:  Patient ID: Diane Vincent, female    DOB: [birth year],  MRN: [id number]  Chief Complaint  Patient presents with   Ingrown Toenail    32 y.o. female presents with the above complaint.  Patient presents with complaint of right medial hallux border ingrown  Patient states been present for quite some time is progressive gotten worse.  She would like she states ingrown hurts with ambulation hurts with pressure she would like to have it removed she has not seen anyone else prior to seeing me.  She denies any other acute complaints.   Review of Systems: Negative except as noted in the HPI. Denies N/V/F/Ch.  Past Medical History:  Diagnosis Date   Anemia 05/12/2017   During pregnancy   Gestational diabetes     Current Outpatient Medications:    clotrimazole-betamethasone (LOTRISONE) cream, Apply topically., Disp: , Rfl:    famotidine (PEPCID) 40 MG tablet, Take 40 mg by mouth daily. , Disp: , Rfl:    fluconazole (DIFLUCAN) 150 MG tablet, Take by mouth., Disp: , Rfl:    levonorgestrel (MIRENA) 20 MCG/24HR IUD, 1 each by Intrauterine route once., Disp: , Rfl:    Prenatal Multivit-Min-Fe-FA (PRENATAL VITAMINS PO), Take 1 tablet by mouth daily., Disp: , Rfl:   Social History   Tobacco Use  Smoking Status Never  Smokeless Tobacco Never    No Known Allergies Objective:  There were no vitals filed for this visit. There is no height or weight on file to calculate BMI. Constitutional Well developed. Well nourished.  Vascular Dorsalis pedis pulses palpable bilaterally. Posterior tibial pulses palpable bilaterally. Capillary refill normal to all digits.  No cyanosis or clubbing noted. Pedal hair growth normal.  Neurologic Normal speech. Oriented to person, place, and time. Epicritic sensation to light touch grossly present bilaterally.  Dermatologic Painful ingrowing nail at medial nail borders of the hallux nail right. No other open wounds. No skin lesions.   Orthopedic: Normal joint ROM without pain or crepitus bilaterally. No visible deformities. No bony tenderness.   Radiographs: None Assessment:   1. Ingrown toenail of right foot     Plan:  Patient was evaluated and treated and all questions answered.  Ingrown Nail, right -Patient elects to proceed with minor surgery to remove ingrown toenail removal today. Consent reviewed and signed by patient. -Ingrown nail excised. See procedure note. -Educated on post-procedure care including soaking. Written instructions provided and reviewed. -Patient to follow up in 2 weeks for nail check.   Procedure: Excision of Ingrown Toenail Location: Right 1st toe medial nail borders. Anesthesia: Lidocaine 1% plain; 1.5 mL and Marcaine 0.5% plain; 1.5 mL, digital block. Skin Prep: Betadine. Dressing: Silvadene; telfa; dry, sterile, compression dressing. Technique: Following skin prep, the toe was exsanguinated and a tourniquet was secured at the base of the toe. The affected nail border was freed, split with a nail splitter, and excised. Chemical matrixectomy was then performed with phenol and irrigated out with alcohol. The tourniquet was then removed and sterile dressing applied. Disposition: Patient tolerated procedure well. Patient to return in 2 weeks for follow-up.   No follow-ups on file.

## 2022-08-31 ENCOUNTER — Ambulatory Visit: Payer: Self-pay | Admitting: Podiatry

## 2023-01-14 ENCOUNTER — Ambulatory Visit: Payer: Self-pay

## 2023-01-18 ENCOUNTER — Ambulatory Visit (LOCAL_COMMUNITY_HEALTH_CENTER): Payer: Self-pay

## 2023-01-18 DIAGNOSIS — Z719 Counseling, unspecified: Secondary | ICD-10-CM

## 2023-01-18 DIAGNOSIS — Z23 Encounter for immunization: Secondary | ICD-10-CM

## 2023-01-18 NOTE — Progress Notes (Signed)
 In nurse clinic for immunizations as needed for immigration. Requests MMR, Covid, Hep B today. States Hep B titer low on 01/03/2023. Presents RN with titer results. Result  Copy sent for scanning. Per [facility] , has completed Hep B series. Per [facility] Dr JAYSON Helling, Hep B vaccine dose indicated. Options  for Hep B reviewed with patient regarding cost and [employer] PAP application.   Meets criteria for state supplied MMR and Covid.  Paid for Hep B (Recombivax) today.  Covid Pfizer Comirnaty 2024-25 (45yrs+), MMR, and Hep B given today.  Tolerated vaccines well today. Updated [facility] copy given and recommended schedule explained. [facility] protocol reviewed for low Hep B titer. Patient to contact [facility] if wants to pursue [employer] PAP for next MMR.   Declines to stay for 15 min observation. Roda Lauture, RN
